# Patient Record
Sex: Female | Born: 1955 | Race: White | Hispanic: No | Marital: Married | Smoking: Never smoker
Health system: Southern US, Community
[De-identification: ages and names within clinical notes are randomized; demographics above are authoritative.]

## PROBLEM LIST (undated history)

## (undated) DIAGNOSIS — E785 Hyperlipidemia, unspecified: Secondary | ICD-10-CM

## (undated) DIAGNOSIS — R002 Palpitations: Secondary | ICD-10-CM

## (undated) DIAGNOSIS — E079 Disorder of thyroid, unspecified: Secondary | ICD-10-CM

## (undated) HISTORY — DX: Palpitations: R00.2

## (undated) HISTORY — DX: Hyperlipidemia, unspecified: E78.5

## (undated) HISTORY — DX: Disorder of thyroid, unspecified: E07.9

## (undated) HISTORY — PX: NASAL FRACTURE SURGERY: SHX718

---
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
## 2015-11-17 ENCOUNTER — Telehealth: Payer: Self-pay | Admitting: Cardiovascular Disease

## 2015-11-17 NOTE — Telephone Encounter (Signed)
Received records from [facility] for appointment on 12/07/15 with Dr Gwenlyn Found.  Records given to [facility] (medical records) for Dr Kennon Holter schedule on 12/07/15. lp

## 2015-11-21 ENCOUNTER — Telehealth: Payer: Self-pay | Admitting: Cardiovascular Disease

## 2015-11-21 NOTE — Telephone Encounter (Signed)
[id number] Received Referral packet from [facility] for upcoming appointment with Dr. Gwenlyn Found on 12/07/2015.  Records giving to [facility]. cbr

## 2015-12-01 ENCOUNTER — Encounter: Payer: Self-pay | Admitting: Cardiovascular Disease

## 2015-12-01 ENCOUNTER — Ambulatory Visit (INDEPENDENT_AMBULATORY_CARE_PROVIDER_SITE_OTHER): Payer: BC Managed Care – PPO | Admitting: Cardiovascular Disease

## 2015-12-01 VITALS — BP 104/68 | HR 69 | Ht 70.0 in | Wt 170.2 lb

## 2015-12-01 DIAGNOSIS — R002 Palpitations: Secondary | ICD-10-CM | POA: Diagnosis not present

## 2015-12-01 NOTE — Progress Notes (Signed)
12/01/2015 Michelle Cochran   [birth year]  [id number]  Primary Physician Manon Hilding, MD Primary Cardiologist: Lorretta Harp MD Renae Gloss   HPI:  Michelle Cochran is a delightful 60 year old mildly overweight married Caucasian female mother of 2 accompanied by her husband Michelle Cochran today. She is referred by Dr. Quintin Alto for cardiac evaluation because of palpitations. She has no cardiac risk factors She's never had a heart attack or stroke. She denies chest pain or shortness of breath. She is fairly active around the house. She had one episode of prolonged palpitations 2 years ago while she was at the [place] resort in the evening after having more than her usual amount of alcohol. The symptoms lasted for several hours and resolved spontaneously. She sets one subsequent episode was less intense.   Current Outpatient Prescriptions  Medication Sig Dispense Refill  . levothyroxine (SYNTHROID, LEVOTHROID) 50 MCG tablet Take 50 mcg by mouth daily.    . meloxicam (MOBIC) 15 MG tablet Take 15 mg by mouth as needed.  2  . sertraline (ZOLOFT) 100 MG tablet Take 100 mg by mouth daily.  6   No current facility-administered medications for this visit.    No Known Allergies  Social History   Social History  . Marital Status: Married    Spouse Name: N/A  . Number of Children: N/A  . Years of Education: N/A   Occupational History  . Not on file.   Social History Main Topics  . Smoking status: Never Smoker   . Smokeless tobacco: Not on file  . Alcohol Use: 0.6 oz/week    1 Standard drinks or equivalent per week     Comment: social   . Drug Use: No  . Sexual Activity: Not on file   Other Topics Concern  . Not on file   Social History Narrative  . No narrative on file     Review of Systems: General: negative for chills, fever, night sweats or weight changes.  Cardiovascular: negative for chest pain, dyspnea on exertion, edema, orthopnea, palpitations, paroxysmal nocturnal  dyspnea or shortness of breath Dermatological: negative for rash Respiratory: negative for cough or wheezing Urologic: negative for hematuria Abdominal: negative for nausea, vomiting, diarrhea, bright red blood per rectum, melena, or hematemesis Neurologic: negative for visual changes, syncope, or dizziness All other systems reviewed and are otherwise negative except as noted above.    Blood pressure 104/68, pulse 69, height 5\' 10"  (1.778 m), weight 170 lb 3.2 oz (77.202 kg).  General appearance: alert and no distress Neck: no adenopathy, no carotid bruit, no JVD, supple, symmetrical, trachea midline and thyroid not enlarged, symmetric, no tenderness/mass/nodules Lungs: clear to auscultation bilaterally Heart: regular rate and rhythm, S1, S2 normal, no murmur, click, rub or gallop Extremities: extremities normal, atraumatic, no cyanosis or edema  EKG normal sinus rhythm at 69 with an estimated T-wave changes. I personally reviewed this EKG  ASSESSMENT AND PLAN:   Palpitations Michelle Cochran is a 60 year old mildly overweight married Caucasian female mother of 2 children referred by her primary care physician for evaluation of palpitations. She has no chronic retractors. Her first episode of palpitations was approximately 2 years ago while she was at the [place] resort at night after having more than her usual amount of alcohol. The episode lasted 3 or 4 hours and then resolve spontaneously. She's had one less intense episode since that time but otherwise does not complain of palpitations. She is very active around the house. She denies chest  pain or shortness of breath.      Lorretta Harp MD FACP,FACC,FAHA, [facility] 12/01/2015 4:04 PM

## 2015-12-01 NOTE — Assessment & Plan Note (Signed)
Michelle Cochran is a 60 year old mildly overweight married Caucasian female mother of 2 children referred by her primary care physician for evaluation of palpitations. She has no chronic retractors. Her first episode of palpitations was approximately 2 years ago while she was at the break [place] resort at night after having more than her usual amount of alcohol. The episode lasted 3 or 4 hours and then resolve spontaneously. She's had one less intense episode since that time but otherwise does not complain of palpitations. She is very active around the house. She denies chest pain or shortness of breath.

## 2015-12-01 NOTE — Patient Instructions (Signed)
Medication Instructions:  Your physician recommends that you continue on your current medications as directed. Please refer to the Current Medication list given to you today.   Labwork: NONE  Testing/Procedures: NONE  Follow-Up: Follow up with Dr. Gwenlyn Found as needed.   Any Other Special Instructions Will Be Listed Below (If Applicable).     If you need a refill on your cardiac medications before your next appointment, please call your pharmacy.

## 2015-12-07 ENCOUNTER — Ambulatory Visit: Payer: BC Managed Care – PPO | Admitting: Cardiovascular Disease

## 2016-07-02 ENCOUNTER — Emergency Department (HOSPITAL_COMMUNITY)
Admission: EM | Admit: 2016-07-02 | Discharge: 2016-07-02 | Disposition: A | Discharge status: Home / Self Care | Payer: BC Managed Care – PPO | Attending: Emergency Medicine | Admitting: Emergency Medicine

## 2016-07-02 ENCOUNTER — Emergency Department (HOSPITAL_COMMUNITY): Payer: BC Managed Care – PPO

## 2016-07-02 ENCOUNTER — Encounter (HOSPITAL_COMMUNITY): Payer: Self-pay | Admitting: Emergency Medicine

## 2016-07-02 DIAGNOSIS — M546 Pain in thoracic spine: Secondary | ICD-10-CM | POA: Diagnosis present

## 2016-07-02 DIAGNOSIS — Y929 Unspecified place or not applicable: Secondary | ICD-10-CM | POA: Insufficient documentation

## 2016-07-02 DIAGNOSIS — Z79899 Other long term (current) drug therapy: Secondary | ICD-10-CM | POA: Diagnosis not present

## 2016-07-02 DIAGNOSIS — T148XXA Other injury of unspecified body region, initial encounter: Secondary | ICD-10-CM

## 2016-07-02 DIAGNOSIS — S39012A Strain of muscle, fascia and tendon of lower back, initial encounter: Secondary | ICD-10-CM | POA: Insufficient documentation

## 2016-07-02 DIAGNOSIS — X58XXXA Exposure to other specified factors, initial encounter: Secondary | ICD-10-CM | POA: Diagnosis not present

## 2016-07-02 DIAGNOSIS — Y939 Activity, unspecified: Secondary | ICD-10-CM | POA: Diagnosis not present

## 2016-07-02 DIAGNOSIS — Y999 Unspecified external cause status: Secondary | ICD-10-CM | POA: Diagnosis not present

## 2016-07-02 LAB — CBC WITH DIFFERENTIAL/PLATELET
Basophils Absolute: 0 10*3/uL (ref 0.0–0.1)
Basophils Relative: 1 %
Eosinophils Absolute: 0 10*3/uL (ref 0.0–0.7)
Eosinophils Relative: 0 %
HCT: 44.9 % (ref 36.0–46.0)
HEMOGLOBIN: 14.9 g/dL (ref 12.0–15.0)
LYMPHS ABS: 1.5 10*3/uL (ref 0.7–4.0)
LYMPHS PCT: 21 %
MCH: 30.5 pg (ref 26.0–34.0)
MCHC: 33.2 g/dL (ref 30.0–36.0)
MCV: 91.8 fL (ref 78.0–100.0)
Monocytes Absolute: 0.6 10*3/uL (ref 0.1–1.0)
Monocytes Relative: 8 %
NEUTROS PCT: 70 %
Neutro Abs: 5.3 10*3/uL (ref 1.7–7.7)
Platelets: 216 10*3/uL (ref 150–400)
RBC: 4.89 MIL/uL (ref 3.87–5.11)
RDW: 12.9 % (ref 11.5–15.5)
WBC: 7.5 10*3/uL (ref 4.0–10.5)

## 2016-07-02 LAB — COMPREHENSIVE METABOLIC PANEL
ALT: 14 U/L (ref 14–54)
AST: 19 U/L (ref 15–41)
Albumin: 4.7 g/dL (ref 3.5–5.0)
Alkaline Phosphatase: 95 U/L (ref 38–126)
Anion gap: 9 (ref 5–15)
BUN: 17 mg/dL (ref 6–20)
CHLORIDE: 103 mmol/L (ref 101–111)
CO2: 25 mmol/L (ref 22–32)
Calcium: 9.6 mg/dL (ref 8.9–10.3)
Creatinine, Ser: 0.72 mg/dL (ref 0.44–1.00)
Glucose, Bld: 110 mg/dL — ABNORMAL HIGH (ref 65–99)
POTASSIUM: 3.8 mmol/L (ref 3.5–5.1)
Sodium: 137 mmol/L (ref 135–145)
Total Bilirubin: 0.5 mg/dL (ref 0.3–1.2)
Total Protein: 8 g/dL (ref 6.5–8.1)

## 2016-07-02 LAB — LIPASE, BLOOD: LIPASE: 40 U/L (ref 11–51)

## 2016-07-02 MED ORDER — KETOROLAC TROMETHAMINE 10 MG PO TABS: 10.0000 mg | ORAL_TABLET | Freq: Four times a day (QID) | ORAL | 0 refills | Status: DC | PRN

## 2016-07-02 MED ORDER — ONDANSETRON 8 MG PO TBDP
8.0000 mg | ORAL_TABLET | Freq: Once | ORAL | Status: AC
  Administered 2016-07-02: 8 mg via ORAL
  Filled 2016-07-02: qty 1

## 2016-07-02 MED ORDER — DIAZEPAM 5 MG PO TABS
5.0000 mg | ORAL_TABLET | Freq: Once | ORAL | Status: AC
  Administered 2016-07-02: 5 mg via ORAL
  Filled 2016-07-02: qty 1

## 2016-07-02 MED ORDER — DIAZEPAM 5 MG PO TABS: 5.0000 mg | ORAL_TABLET | Freq: Three times a day (TID) | ORAL | 0 refills | Status: DC | PRN

## 2016-07-02 MED ORDER — KETOROLAC TROMETHAMINE 60 MG/2ML IM SOLN
60.0000 mg | Freq: Once | INTRAMUSCULAR | Status: AC
  Administered 2016-07-02: 60 mg via INTRAMUSCULAR
  Filled 2016-07-02: qty 2

## 2016-07-02 MED ORDER — OXYCODONE-ACETAMINOPHEN 5-325 MG PO TABS
1.0000 | ORAL_TABLET | Freq: Once | ORAL | Status: AC
  Administered 2016-07-02: 1 via ORAL
  Filled 2016-07-02: qty 1

## 2016-07-02 NOTE — ED Notes (Signed)
Spouse came to nursing station requesting "something else for her pain, like a shot or something." Advised Tammy Tripplett PA.

## 2016-07-02 NOTE — Discharge Instructions (Signed)
Apply ice packs on/off.  Follow-up with your doctor for recheck this week if not improving.

## 2016-07-02 NOTE — ED Notes (Signed)
Pt states her pain "feels like a pinched nerve."

## 2016-07-02 NOTE — ED Provider Notes (Signed)
[facility] DEPT Provider Note   CSN: [id number] Arrival date & time: 07/02/16  1100  By signing my name below, I, Michelle Cochran, attest that this documentation has been prepared under the direction and in the presence of Elin Seats, PA-C. Electronically Signed: Judithann Sauger, ED Scribe. 07/02/16. 12:44 PM.   History   Chief Complaint Chief Complaint  Patient presents with  . Back Pain    HPI Comments: Michelle Cochran is a 60 y.o. female with a hx of Thyroid disease who presents to the Emergency Department complaining of sudden onset, constant 8/10 right upper back pain that intermittently radiates up her right posterior shoulder onset 8 am this morning. She notes that standing up and raising her right arm makes the pain worse and applying pressure to the area does not affect it.  She denies any recent falls, injuries, or known trauma. No alleviating factors noted. Pt has not tried any medications PTA.  She denies noticing episodes of nausea after eating, or a hx of abdominal surgeries or pneumonia. She also denies any fever, chills, cough, chest pain, shortness of breath, neck pain, abdominal pain, vomiting, weakness, numbness/tingling, or any other symptoms.    The history is provided by the patient. No language interpreter was used.  Back Pain   This is a new problem. The current episode started 3 to 5 hours ago. The problem has been gradually worsening. The pain is associated with no known injury. The pain is present in the thoracic spine. The pain is moderate. The symptoms are aggravated by certain positions. The pain is the same all the time. Pertinent negatives include no chest pain, no fever, no numbness, no headaches, no abdominal pain, no dysuria and no weakness. She has tried nothing for the symptoms. The treatment provided no relief.    Past Medical History:  Diagnosis Date  . Palpitations   . Thyroid disease     Patient Active Problem List   Diagnosis Date  Noted  . Palpitations 12/01/2015    History reviewed. No pertinent surgical history.  OB History    Gravida Para Term Preterm AB Living   2 2 2          SAB TAB Ectopic Multiple Live Births                   Home Medications    Prior to Admission medications   Medication Sig Start Date End Date Taking? Authorizing Provider  levothyroxine (SYNTHROID, LEVOTHROID) 50 MCG tablet Take 50 mcg by mouth daily. 10/11/15  Yes Historical Provider, MD    Family History Family History  Problem Relation Age of Onset  . Arrhythmia Father     afib  . Heart attack Maternal Grandfather 40  . Heart attack Paternal Grandmother 58  . Cancer Paternal Grandfather     Social History Social History  Substance Use Topics  . Smoking status: Never Smoker  . Smokeless tobacco: Never Used  . Alcohol use 0.6 oz/week    1 Standard drinks or equivalent per week     Comment: social      Allergies   Patient has no known allergies.   Review of Systems Review of Systems  Constitutional: Negative for chills and fever.  Eyes: Negative for visual disturbance.  Respiratory: Negative for cough and shortness of breath.   Cardiovascular: Negative for chest pain.  Gastrointestinal: Negative for abdominal distention, abdominal pain and vomiting.  Genitourinary: Negative for dysuria and flank pain.  Musculoskeletal: Positive for back pain.  Neurological: Negative for weakness, numbness and headaches.     Physical Exam Updated Vital Signs BP 130/69 (BP Location: Left Arm)   Pulse 95   Temp 97.5 F (36.4 C) (Oral)   Resp 22   Ht 5\' 9"  (1.753 m)   Wt 165 lb (74.8 kg)   SpO2 100%   BMI 24.37 kg/m   Physical Exam  Constitutional: She is oriented to person, place, and time. She appears well-developed and well-nourished.  HENT:  Head: Normocephalic and atraumatic.  Cardiovascular: Normal rate.   Pulmonary/Chest: Effort normal.  Musculoskeletal: She exhibits tenderness.       Right shoulder:  She exhibits tenderness and spasm. She exhibits normal range of motion, no bony tenderness, no swelling and normal strength.       Arms: Focal tenderness around the right subscapular region; no edema, rash, or erythema; no spinal tenderness. No motor weakness on exam, distal sensation intact.   Neurological: She is alert and oriented to person, place, and time.  Skin: Skin is warm and dry.  Psychiatric: She has a normal mood and affect.  Nursing note and vitals reviewed.    ED Treatments / Results  DIAGNOSTIC STUDIES: Oxygen Saturation is 100% on RA, normal by my interpretation.    COORDINATION OF CARE: 12:39 PM- Pt advised of plan for treatment and pt agrees. Pt will receive lab work and right rib x-ray for further evaluation.    Labs (all labs ordered are listed, but only abnormal results are displayed) Labs Reviewed  COMPREHENSIVE METABOLIC PANEL - Abnormal; Notable for the following:       Result Value   Glucose, Bld 110 (*)    All other components within normal limits  CBC WITH DIFFERENTIAL/PLATELET  LIPASE, BLOOD    EKG  EKG Interpretation None       Radiology Dg Ribs Unilateral W/chest Right  Result Date: 07/02/2016 CLINICAL DATA:  RIGHT upper back pain EXAM: RIGHT RIBS AND CHEST - 3+ VIEW COMPARISON:  None. FINDINGS: Normal mediastinum and cardiac silhouette. Normal pulmonary vasculature. No evidence of effusion, infiltrate, or pneumothorax. No acute bony abnormality. Dedicated views of the RIGHT ribs demonstrate no displaced fracture. IMPRESSION: 1. No acute cardiopulmonary findings. 2. No RIGHT rib abnormality. Electronically Signed   By: Suzy Bouchard M.D.   On: 07/02/2016 13:55    Procedures Procedures (including critical care time)  Medications Ordered in ED Medications  oxyCODONE-acetaminophen (PERCOCET/ROXICET) 5-325 MG per tablet 1 tablet (1 tablet Oral Given 07/02/16 1252)  ondansetron (ZOFRAN-ODT) disintegrating tablet 8 mg (8 mg Oral Given  07/02/16 1252)  ketorolac (TORADOL) injection 60 mg (60 mg Intramuscular Given 07/02/16 1431)  diazepam (VALIUM) tablet 5 mg (5 mg Oral Given 07/02/16 1431)     Initial Impression / Assessment and Plan / ED Course  Yohann Curl, PA-C has reviewed the triage vital signs and the nursing notes.  Pertinent labs & imaging results that were available during my care of the patient were reviewed by me and considered in my medical decision making (see chart for details).  Clinical Course     Pt with reproducible tenderness of the right scapular region.  Pt is comfortable appearing.  Vitals stable.  Sx's are likely musculoskeletal.  No abd pain, vomiting to suggest cholecystitis, doubt dissection.  Pt is feeling better, has ambulated to the desk and requested fluids.  Agrees to symptomatic tx and close PMD f/u.  Return precautions given.  The patient appears reasonably screened and/or stabilized for discharge and I doubt  any other medical condition or other [facility] requiring further screening, evaluation, or treatment in the ED at this time prior to discharge.   Final Clinical Impressions(s) / ED Diagnoses   Final diagnoses:  Muscle strain    New Prescriptions New Prescriptions   No medications on file   I personally performed the services described in this documentation, which was scribed in my presence. The recorded information has been reviewed and is accurate.    Kem Parkinson, PA-C 07/05/16 1514    Elnora Morrison, MD 07/07/16 [id number]

## 2016-07-02 NOTE — ED Triage Notes (Signed)
Pt reports R sided thoracic pain that started at approx 0800 today. No known injury. Pt states standing makes the pain worse. Pt denies tenderness to touch. Pt with increased pain with raising her R arm.

## 2017-02-21 ENCOUNTER — Other Ambulatory Visit (HOSPITAL_COMMUNITY): Payer: Self-pay | Admitting: Family Medicine

## 2017-02-21 ENCOUNTER — Other Ambulatory Visit: Payer: Self-pay | Admitting: Family Medicine

## 2017-02-21 DIAGNOSIS — R221 Localized swelling, mass and lump, neck: Secondary | ICD-10-CM

## 2017-02-21 DIAGNOSIS — R591 Generalized enlarged lymph nodes: Secondary | ICD-10-CM

## 2017-02-22 ENCOUNTER — Ambulatory Visit
Admission: RE | Admit: 2017-02-22 | Discharge: 2017-02-22 | Disposition: A | Discharge status: Home / Self Care | Payer: BC Managed Care – PPO | Source: Ambulatory Visit | Attending: Family Medicine | Admitting: Family Medicine

## 2017-02-22 DIAGNOSIS — R221 Localized swelling, mass and lump, neck: Secondary | ICD-10-CM

## 2017-02-22 DIAGNOSIS — R591 Generalized enlarged lymph nodes: Secondary | ICD-10-CM

## 2017-11-26 IMAGING — DX DG RIBS W/ CHEST 3+V*R*
3 series · 3 of 3 positions shown · non-contrast
Comparison: None.

CLINICAL DATA: RIGHT upper back pain

EXAM:
RIGHT RIBS AND CHEST - 3+ VIEW

[chest pa]
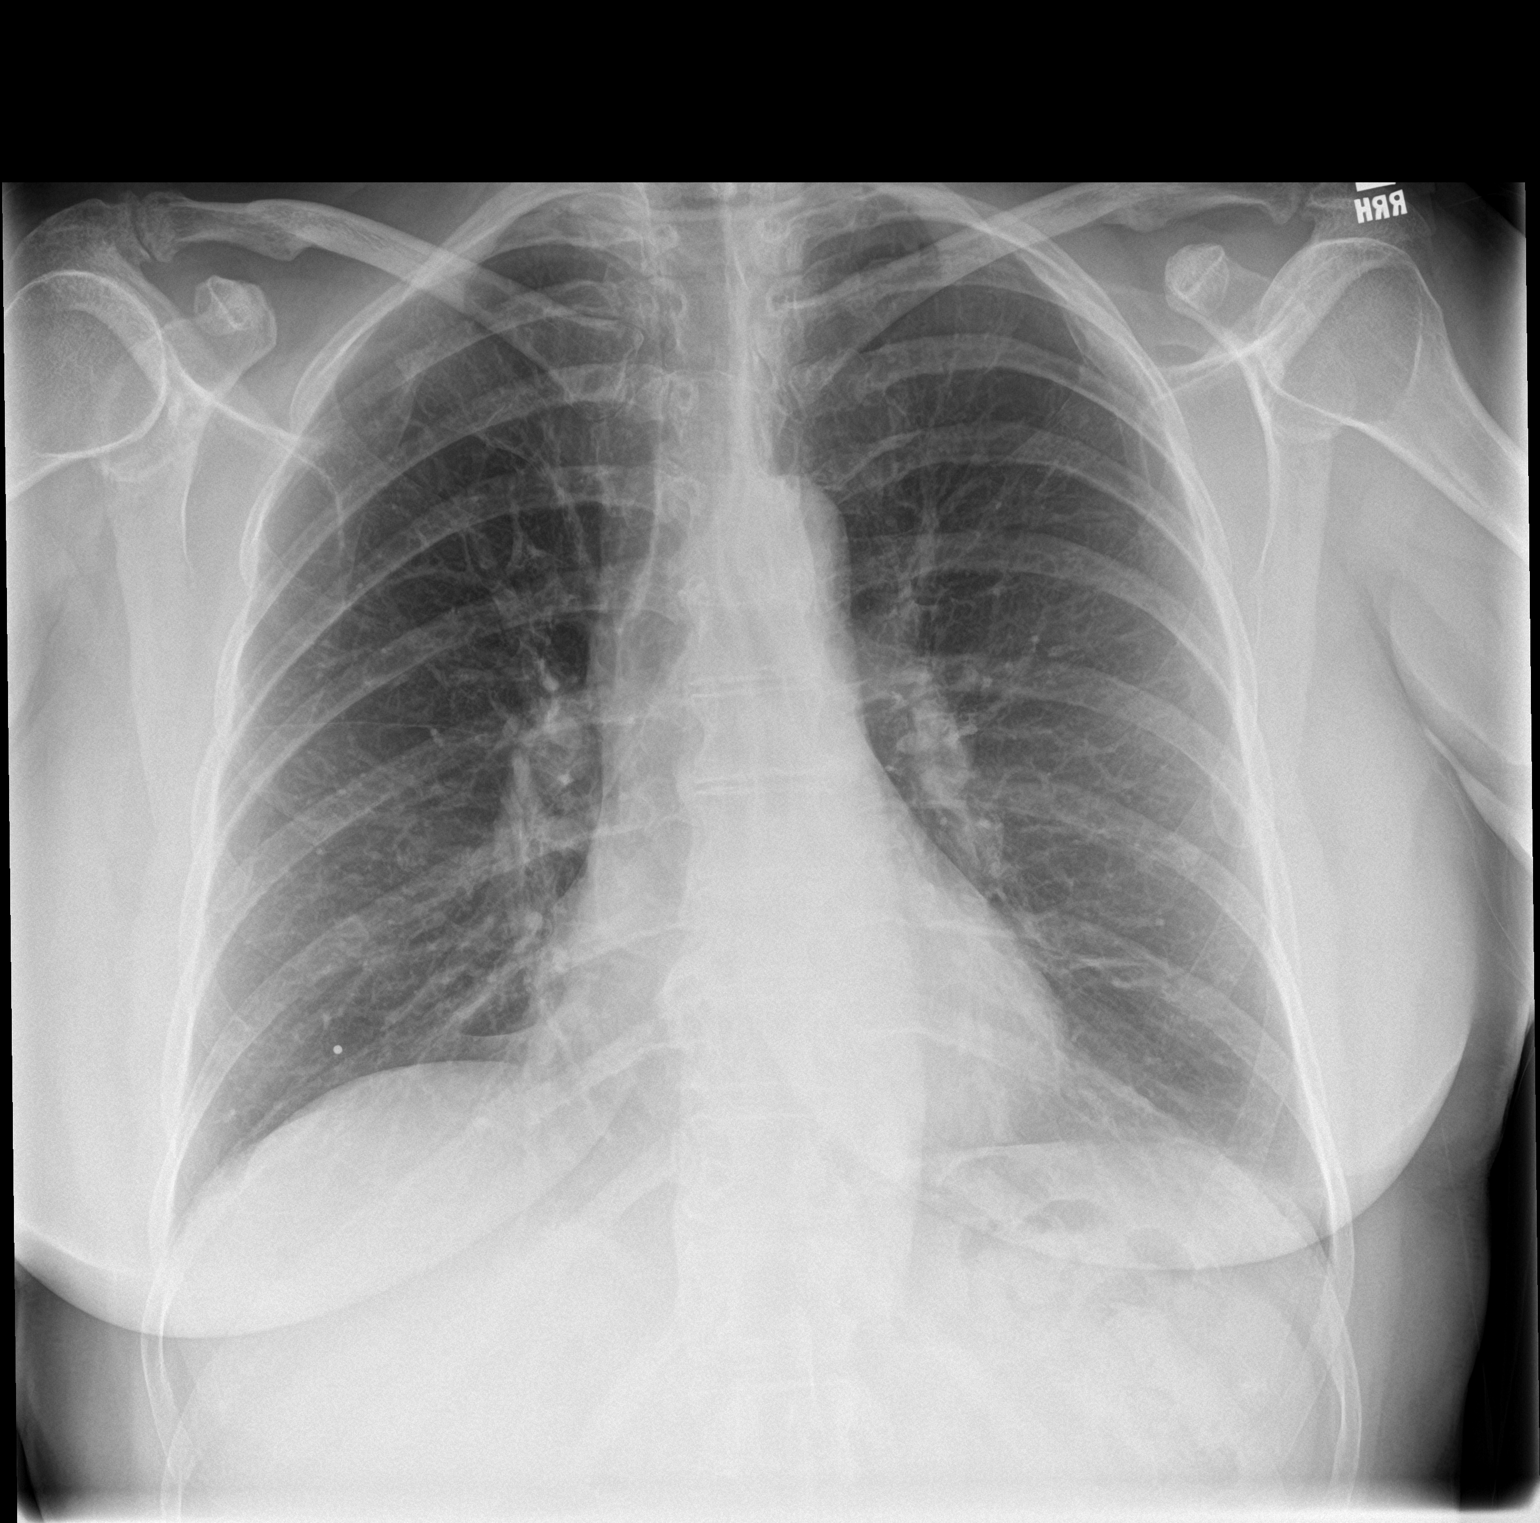

[rib pa]
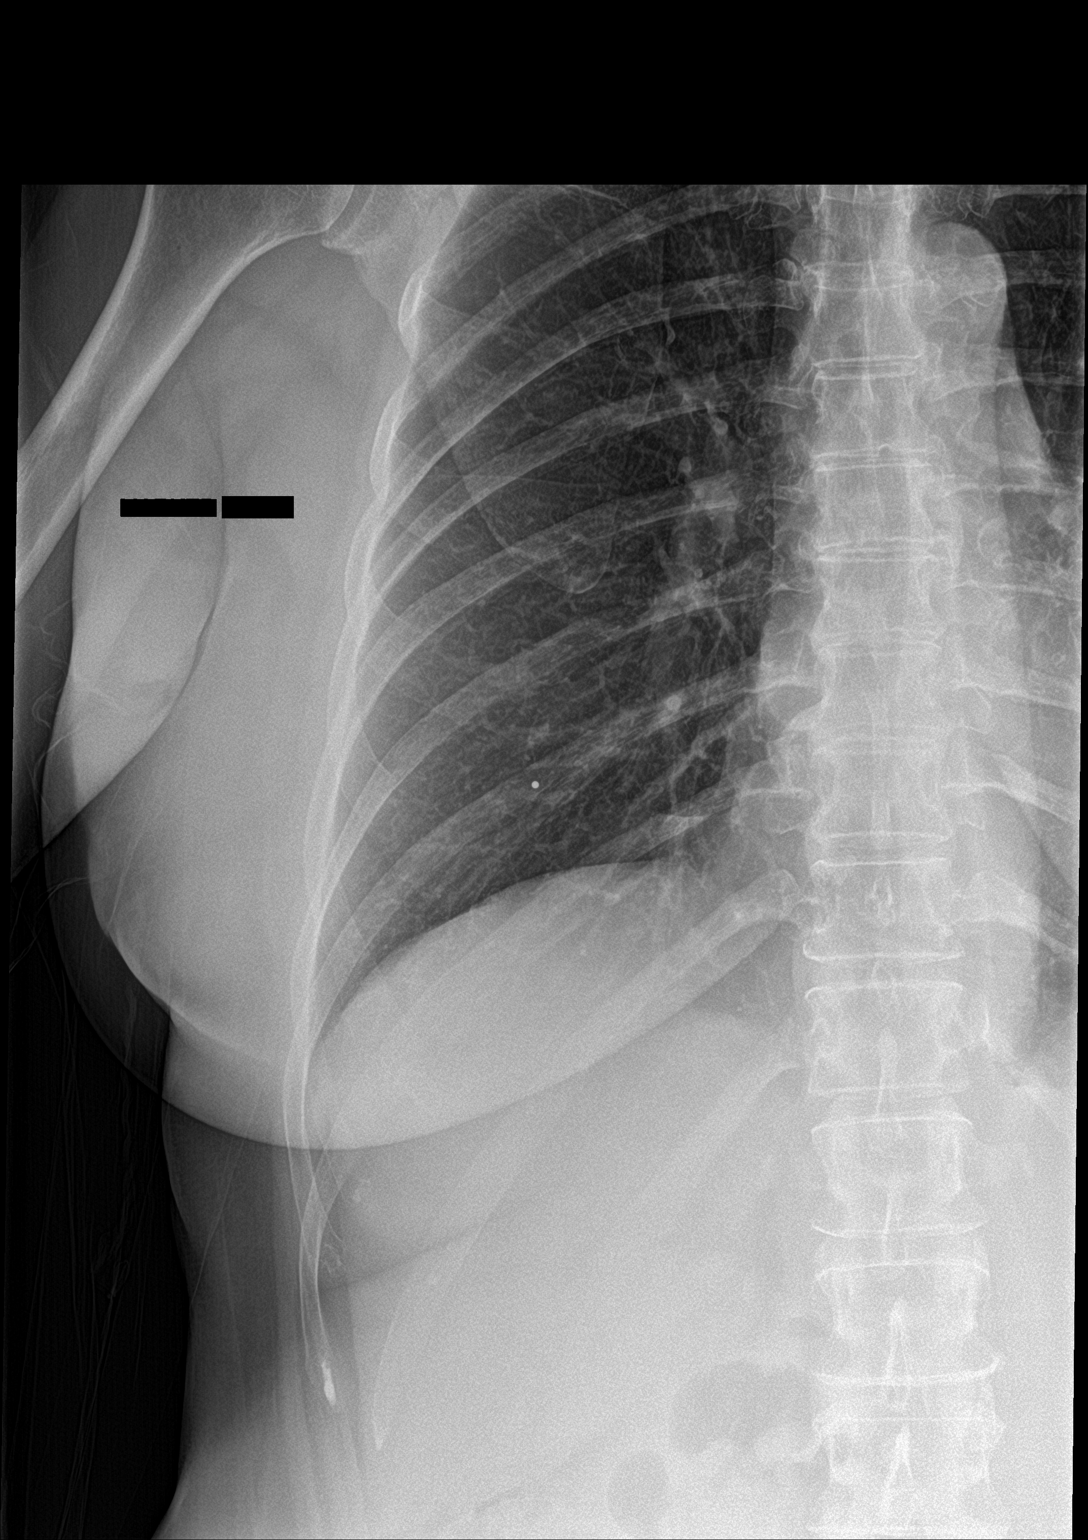

[rib pa obl]
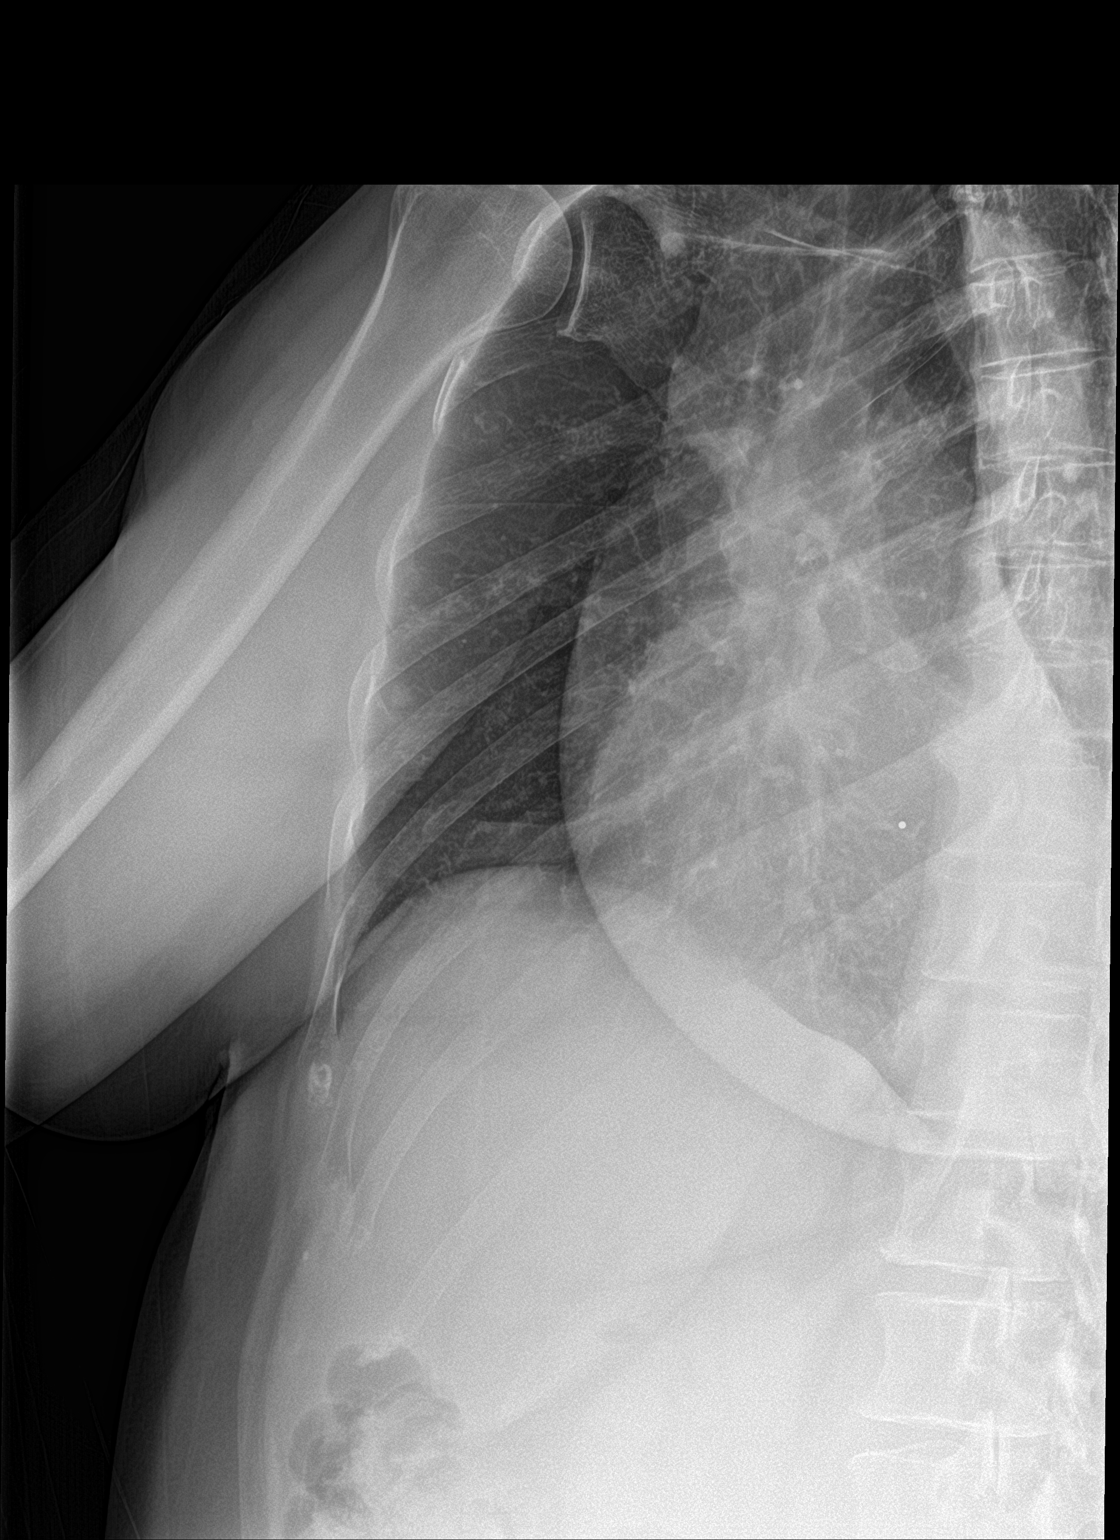

[3 of 3 positions shown; findings below may reference images not displayed]

FINDINGS: Normal mediastinum and cardiac silhouette. Normal pulmonary
vasculature. No evidence of effusion, infiltrate, or pneumothorax.
No acute bony abnormality.

Dedicated views of the RIGHT ribs demonstrate no displaced fracture.
IMPRESSION: 1. No acute cardiopulmonary findings.
2. No RIGHT rib abnormality.

## 2018-05-29 ENCOUNTER — Encounter (INDEPENDENT_AMBULATORY_CARE_PROVIDER_SITE_OTHER): Payer: BC Managed Care – PPO | Admitting: Ophthalmology

## 2018-05-29 DIAGNOSIS — H43813 Vitreous degeneration, bilateral: Secondary | ICD-10-CM | POA: Diagnosis not present

## 2018-07-10 ENCOUNTER — Encounter (INDEPENDENT_AMBULATORY_CARE_PROVIDER_SITE_OTHER): Payer: BC Managed Care – PPO | Admitting: Ophthalmology

## 2020-12-13 ENCOUNTER — Ambulatory Visit (INDEPENDENT_AMBULATORY_CARE_PROVIDER_SITE_OTHER): Payer: Medicare Other | Admitting: Otolaryngology

## 2020-12-13 ENCOUNTER — Encounter (INDEPENDENT_AMBULATORY_CARE_PROVIDER_SITE_OTHER): Payer: Self-pay | Admitting: Otolaryngology

## 2020-12-13 ENCOUNTER — Other Ambulatory Visit: Payer: Self-pay

## 2020-12-13 VITALS — Temp 98.1°F

## 2020-12-13 DIAGNOSIS — H938X1 Other specified disorders of right ear: Secondary | ICD-10-CM

## 2020-12-13 NOTE — Progress Notes (Signed)
HPI: Michelle Cochran is a 65 y.o. female who returns today for evaluation of right ear.  She feels like the right ear is been stopped up ever since she had a cold about 6 weeks ago.  She has previously been seen here in the past for wax buildup in her ears.  Her last visit was over 6 years ago..  Past Medical History:  Diagnosis Date  . Palpitations   . Thyroid disease    No past surgical history on file. Social History   Socioeconomic History  . Marital status: Married    Spouse name: Not on file  . Number of children: Not on file  . Years of education: Not on file  . Highest education level: Not on file  Occupational History  . Not on file  Tobacco Use  . Smoking status: Never Smoker  . Smokeless tobacco: Never Used  Substance and Sexual Activity  . Alcohol use: Yes    Alcohol/week: 1.0 standard drink    Types: 1 Standard drinks or equivalent per week    Comment: social   . Drug use: No  . Sexual activity: Not on file  Other Topics Concern  . Not on file  Social History Narrative  . Not on file   Social Determinants of Health   Financial Resource Strain: Not on file  Food Insecurity: Not on file  Transportation Needs: Not on file  Physical Activity: Not on file  Stress: Not on file  Social Connections: Not on file   Family History  Problem Relation Age of Onset  . Arrhythmia Father        afib  . Heart attack Maternal Grandfather 40  . Heart attack Paternal Grandmother 9  . Cancer Paternal Grandfather    No Known Allergies Prior to Admission medications   Medication Sig Start Date End Date Taking? Authorizing Provider  diazepam (VALIUM) 5 MG tablet Take 1 tablet (5 mg total) by mouth every 8 (eight) hours as needed for muscle spasms. 07/02/16   Triplett, Tammy, PA-C  ketorolac (TORADOL) 10 MG tablet Take 1 tablet (10 mg total) by mouth every 6 (six) hours as needed. 07/02/16   Triplett, Tammy, PA-C  levothyroxine (SYNTHROID, LEVOTHROID) 50 MCG tablet Take 50  mcg by mouth daily. 10/11/15   [provider]     Positive ROS: Otherwise negative  All other systems have been reviewed and were otherwise negative with the exception of those mentioned in the HPI and as above.  Physical Exam: Constitutional: Alert, well-appearing, no acute distress Ears: External ears without lesions or tenderness.  She has small ear canals bilaterally.  Minimal wax buildup that is nonobstructing.  TMs were clear bilaterally with no middle ear effusion noted.  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  AC was greater than BC bilaterally. Nasal: External nose without lesions. Septum with mild deformity and mild rhinitis.  No signs of infection. Clear nasal passages otherwise. Oral: Lips and gums without lesions. Tongue and palate mucosa without lesions. Posterior oropharynx clear. Neck: No palpable adenopathy or masses Respiratory: Breathing comfortably  Skin: No facial/neck lesions or rash noted.  Procedures  Assessment: Right ear congestion.  No significant wax buildup or middle ear effusion noted.  Plan: She will follow-up as needed.   Radene Journey, MD

## 2021-04-18 ENCOUNTER — Encounter: Payer: Self-pay | Admitting: Internal Medicine

## 2021-05-19 ENCOUNTER — Other Ambulatory Visit: Payer: Self-pay

## 2021-05-19 ENCOUNTER — Ambulatory Visit (AMBULATORY_SURGERY_CENTER): Payer: Medicare Other | Admitting: *Deleted

## 2021-05-19 VITALS — Ht 69.5 in | Wt 165.0 lb

## 2021-05-19 DIAGNOSIS — R195 Other fecal abnormalities: Secondary | ICD-10-CM

## 2021-05-19 MED ORDER — SUTAB 1479-225-188 MG PO TABS: 1.0000 | ORAL_TABLET | Freq: Once | ORAL | 0 refills | Status: AC

## 2021-05-19 NOTE — Progress Notes (Signed)
Virtual pre visit completed over telephone.  Patient requests pills over liquid. Instructions forwarded through MyChart and sent to e-mail [email or web address] Mailed Sutab coupon to patient.    No egg or soy allergy known to patient  No issues known to pt with past sedation with any surgeries or procedures Patient denies ever being told they had issues or difficulty with intubation  No FH of Malignant Hyperthermia Pt is not on diet pills Pt is not on  home 02  Pt is not on blood thinners  Pt denies issues with constipation  No A fib or A flutter  Pt is fully vaccinated  for Covid     Due to the COVID-19 pandemic we are asking patients to follow certain guidelines in [place] and the [place]   Pt aware of COVID protocols and [facility] guidelines

## 2021-06-02 ENCOUNTER — Other Ambulatory Visit: Payer: Self-pay

## 2021-06-02 ENCOUNTER — Encounter: Payer: Self-pay | Admitting: Internal Medicine

## 2021-06-02 ENCOUNTER — Ambulatory Visit (AMBULATORY_SURGERY_CENTER): Payer: Medicare Other | Admitting: Internal Medicine

## 2021-06-02 VITALS — BP 146/81 | HR 67 | Temp 97.8°F | Resp 13 | Ht 69.5 in | Wt 168.0 lb

## 2021-06-02 DIAGNOSIS — D122 Benign neoplasm of ascending colon: Secondary | ICD-10-CM | POA: Diagnosis not present

## 2021-06-02 DIAGNOSIS — R195 Other fecal abnormalities: Secondary | ICD-10-CM

## 2021-06-02 DIAGNOSIS — K5730 Diverticulosis of large intestine without perforation or abscess without bleeding: Secondary | ICD-10-CM | POA: Diagnosis not present

## 2021-06-02 HISTORY — PX: COLONOSCOPY: SHX174

## 2021-06-02 MED ORDER — SODIUM CHLORIDE 0.9 % IV SOLN
500.0000 mL | Freq: Once | INTRAVENOUS | Status: DC
Start: 2021-06-02 — End: 2021-06-02

## 2021-06-02 NOTE — Progress Notes (Signed)
Called to room to assist during endoscopic procedure.  Patient ID and intended procedure confirmed with present staff. Received instructions for my participation in the procedure from the performing physician.  

## 2021-06-02 NOTE — Op Note (Signed)
[facility] Patient Name: Michelle Cochran Procedure Date: 06/02/2021 8:07 AM MRN: [id number] Endoscopist: Docia Chuck. Henrene Pastor , MD Age: 65 Referring MD:  Date of Birth: May 21, 1956 Gender: Female Account #: [id number] Procedure:                Colonoscopy with EMR polypectomy; snare polypectomy                            x3; submucosal tattoo Indications:              Positive Cologuard test (March 30, 2021) Medicines:                Monitored Anesthesia Care Procedure:                Pre-Anesthesia Assessment:                           - Prior to the procedure, a History and Physical                            was performed, and patient medications and                            allergies were reviewed. The patient's tolerance of                            previous anesthesia was also reviewed. The risks                            and benefits of the procedure and the sedation                            options and risks were discussed with the patient.                            All questions were answered, and informed consent                            was obtained. Prior Anticoagulants: The patient has                            taken no previous anticoagulant or antiplatelet                            agents. ASA Grade Assessment: II - A patient with                            mild systemic disease. After reviewing the risks                            and benefits, the patient was deemed in                            satisfactory condition to undergo the procedure.  After obtaining informed consent, the colonoscope                            was passed under direct vision. Throughout the                            procedure, the patient's blood pressure, pulse, and                            oxygen saturations were monitored continuously. The                            CF [id number] #[id number] was introduced through the anus                            and  advanced to the the cecum, identified by                            appendiceal orifice and ileocecal valve. The                            ileocecal valve, appendiceal orifice, and rectum                            were photographed. The quality of the bowel                            preparation was excellent. The colonoscopy was                            performed without difficulty. The patient tolerated                            the procedure well. The bowel preparation used was                            SUPREP via split dose instruction. Scope In: 8:24:17 AM Scope Out: 8:54:33 AM Scope Withdrawal Time: 0 hours 27 minutes 34 seconds  Total Procedure Duration: 0 hours 30 minutes 16 seconds  Findings:                 A 15 mm polyp was found in the ascending colon. The                            polyp was semi-pedunculated. The polyp was removed                            with EMR technique (submucosal saline lift followed                            by hot snare polypectomy). Resection and retrieval                            were complete. See images.  A submucosal injection                            tattoo with carbon black was placed at the                            polypectomy site (slightly downstream). See images                           Three smaller polyps were found in the ascending                            colon. The polyps were 2 to 3 mm in size. These                            polyps were removed with a cold snare. Resection                            and retrieval were complete.                           Multiple diverticula were found in the sigmoid                            colon.                           The exam was otherwise without abnormality on                            direct and retroflexion views. Complications:            No immediate complications. Estimated blood loss:                            None. Estimated Blood Loss:     Estimated blood loss:  none. Impression:               - One 15 mm polyp in the ascending colon, removed                            with EMR technique. Resected and retrieved.                           - Three 2 to 3 mm polyps in the ascending colon,                            removed with a cold snare. Resected and retrieved.                           - Diverticulosis in the sigmoid colon.                           - The examination was otherwise normal on direct  and retroflexion views. Recommendation:           - Repeat colonoscopy in 3 years for surveillance,                            if polyps all benign.                           - Patient has a contact number available for                            emergencies. The signs and symptoms of potential                            delayed complications were discussed with the                            patient. Return to normal activities tomorrow.                            Written discharge instructions were provided to the                            patient.                           - Resume previous diet.                           - Continue present medications.                           - Await pathology results. Docia Chuck. Henrene Pastor, MD 06/02/2021 9:11:20 AM This report has been signed electronically.

## 2021-06-02 NOTE — Progress Notes (Signed)
Pt Drowsy. VSS. To PACU, report to RN. No anesthetic complications noted. Bovie site ok

## 2021-06-02 NOTE — Patient Instructions (Signed)
Please read handouts provided. Continue present medications. Await pathology results.   YOU HAD AN ENDOSCOPIC PROCEDURE TODAY AT [facility]:   Refer to the procedure report that was given to you for any specific questions about what was found during the examination.  If the procedure report does not answer your questions, please call your gastroenterologist to clarify.  If you requested that your care partner not be given the details of your procedure findings, then the procedure report has been included in a sealed envelope for you to review at your convenience later.  YOU SHOULD EXPECT: Some feelings of bloating in the abdomen. Passage of more gas than usual.  Walking can help get rid of the air that was put into your GI tract during the procedure and reduce the bloating. If you had a lower endoscopy (such as a colonoscopy or flexible sigmoidoscopy) you may notice spotting of blood in your stool or on the toilet paper. If you underwent a bowel prep for your procedure, you may not have a normal bowel movement for a few days.  Please Note:  You might notice some irritation and congestion in your nose or some drainage.  This is from the oxygen used during your procedure.  There is no need for concern and it should clear up in a day or so.  SYMPTOMS TO REPORT IMMEDIATELY:  Following lower endoscopy (colonoscopy or flexible sigmoidoscopy):  Excessive amounts of blood in the stool  Significant tenderness or worsening of abdominal pains  Swelling of the abdomen that is new, acute   For urgent or emergent issues, a gastroenterologist can be reached at any hour by calling [phone]. Do not use MyChart messaging for urgent concerns.    DIET:  We do recommend a small meal at first, but then you may proceed to your regular diet.  Drink plenty of fluids but you should avoid alcoholic beverages for 24 hours.  ACTIVITY:  You should plan to take it easy for the rest of today and you  should NOT DRIVE or use heavy machinery until tomorrow (because of the sedation medicines used during the test).    FOLLOW UP: Our staff will call the number listed on your records 48-72 hours following your procedure to check on you and address any questions or concerns that you may have regarding the information given to you following your procedure. If we do not reach you, we will leave a message.  We will attempt to reach you two times.  During this call, we will ask if you have developed any symptoms of COVID 19. If you develop any symptoms (ie: fever, flu-like symptoms, shortness of breath, cough etc.) before then, please call [phone].  If you test positive for Covid 19 in the 2 weeks post procedure, please call and report this information to [place].    If any biopsies were taken you will be contacted by phone or by letter within the next 1-3 weeks.  Please call us at [phone] if you have not heard about the biopsies in 3 weeks.    SIGNATURES/CONFIDENTIALITY: You and/or your care partner have signed paperwork which will be entered into your electronic medical record.  These signatures attest to the fact that that the information above on your After Visit Summary has been reviewed and is understood.  Full responsibility of the confidentiality of this discharge information lies with you and/or your care-partner.

## 2021-06-02 NOTE — Progress Notes (Signed)
HISTORY OF PRESENT ILLNESS:  Michelle Cochran is a 65 y.o. female who is sent today for colonoscopy to evaluate positive Cologuard testing collected March 30, 2021.  Reviewed.  No family history of colon cancer.  No active GI complaints  REVIEW OF SYSTEMS:  All non-GI ROS negative  Past Medical History:  Diagnosis Date   Hyperlipidemia    Palpitations    Thyroid disease     Past Surgical History:  Procedure Laterality Date   NASAL FRACTURE SURGERY      Social History Michelle Cochran  reports that she has never smoked. She has never used smokeless tobacco. She reports current alcohol use of about 1.0 standard drink per week. She reports that she does not use drugs.  family history includes Arrhythmia in her father; Cancer in her paternal grandfather; Heart attack (age of onset: 51) in her maternal grandfather; Heart attack (age of onset: 63) in her paternal grandmother.  No Known Allergies     PHYSICAL EXAMINATION:  Vital signs: BP (!) 144/82   Pulse 69   Temp 97.8 F (36.6 C) (Skin)   Resp 15   Ht 5' 9.5" (1.765 m)   Wt 168 lb (76.2 kg)   SpO2 100%   BMI 24.45 kg/m  General: Well-developed, well-nourished, no acute distress HEENT: Sclerae are anicteric, conjunctiva pink. Oral mucosa intact Lungs: Clear Heart: Regular Abdomen: soft, nontender, nondistended, no obvious ascites, no peritoneal signs, normal bowel sounds. No organomegaly. Extremities: No edema Psychiatric: alert and oriented x3. Cooperative     ASSESSMENT:  1.  Positive Cologuard   PLAN:   1.  Colonoscopy

## 2021-06-02 NOTE — Progress Notes (Signed)
Pt's states no medical or surgical changes since previsit or office visit. VS assessed by D.T 

## 2021-06-06 ENCOUNTER — Telehealth: Payer: Self-pay | Admitting: *Deleted

## 2021-06-06 NOTE — Telephone Encounter (Signed)
  Follow up Call-  Call back number 06/02/2021  Post procedure Call Back phone  # [phone]  Permission to leave phone message Yes  Some recent data might be hidden     Patient questions:  Do you have a fever, pain , or abdominal swelling? No. Pain Score  0 *  Have you tolerated food without any problems? Yes.    Have you been able to return to your normal activities? Yes.    Do you have any questions about your discharge instructions: Diet   No. Medications  No. Follow up visit  No.  Do you have questions or concerns about your Care? No.  Actions: * If pain score is 4 or above: No action needed, pain <4. Have you developed a fever since your procedure? no  2.   Have you had an respiratory symptoms (SOB or cough) since your procedure? no  3.   Have you tested positive for COVID 19 since your procedure no  4.   Have you had any family members/close contacts diagnosed with the COVID 19 since your procedure?  no   If yes to any of these questions please route to Joylene John, RN and Joella Prince, RN

## 2021-06-07 ENCOUNTER — Encounter: Payer: Self-pay | Admitting: Internal Medicine

## 2021-08-08 DIAGNOSIS — E7849 Other hyperlipidemia: Secondary | ICD-10-CM | POA: Diagnosis not present

## 2021-08-08 DIAGNOSIS — E782 Mixed hyperlipidemia: Secondary | ICD-10-CM | POA: Diagnosis not present

## 2021-08-08 DIAGNOSIS — E039 Hypothyroidism, unspecified: Secondary | ICD-10-CM | POA: Diagnosis not present

## 2021-09-28 DIAGNOSIS — H10013 Acute follicular conjunctivitis, bilateral: Secondary | ICD-10-CM | POA: Diagnosis not present

## 2021-11-22 DIAGNOSIS — R350 Frequency of micturition: Secondary | ICD-10-CM | POA: Diagnosis not present

## 2021-11-22 DIAGNOSIS — M545 Low back pain, unspecified: Secondary | ICD-10-CM | POA: Diagnosis not present

## 2021-12-13 DIAGNOSIS — L570 Actinic keratosis: Secondary | ICD-10-CM | POA: Diagnosis not present

## 2021-12-13 DIAGNOSIS — D492 Neoplasm of unspecified behavior of bone, soft tissue, and skin: Secondary | ICD-10-CM | POA: Diagnosis not present

## 2021-12-13 DIAGNOSIS — L814 Other melanin hyperpigmentation: Secondary | ICD-10-CM | POA: Diagnosis not present

## 2021-12-13 DIAGNOSIS — C44722 Squamous cell carcinoma of skin of right lower limb, including hip: Secondary | ICD-10-CM | POA: Diagnosis not present

## 2021-12-13 DIAGNOSIS — D225 Melanocytic nevi of trunk: Secondary | ICD-10-CM | POA: Diagnosis not present

## 2021-12-13 DIAGNOSIS — L820 Inflamed seborrheic keratosis: Secondary | ICD-10-CM | POA: Diagnosis not present

## 2021-12-13 DIAGNOSIS — L821 Other seborrheic keratosis: Secondary | ICD-10-CM | POA: Diagnosis not present

## 2022-01-10 DIAGNOSIS — C44722 Squamous cell carcinoma of skin of right lower limb, including hip: Secondary | ICD-10-CM | POA: Diagnosis not present

## 2022-01-10 DIAGNOSIS — C44729 Squamous cell carcinoma of skin of left lower limb, including hip: Secondary | ICD-10-CM | POA: Diagnosis not present

## 2022-01-10 DIAGNOSIS — D485 Neoplasm of uncertain behavior of skin: Secondary | ICD-10-CM | POA: Diagnosis not present

## 2022-01-24 DIAGNOSIS — C44722 Squamous cell carcinoma of skin of right lower limb, including hip: Secondary | ICD-10-CM | POA: Diagnosis not present

## 2022-01-24 DIAGNOSIS — L814 Other melanin hyperpigmentation: Secondary | ICD-10-CM | POA: Diagnosis not present

## 2022-01-24 DIAGNOSIS — D2372 Other benign neoplasm of skin of left lower limb, including hip: Secondary | ICD-10-CM | POA: Diagnosis not present

## 2022-01-31 DIAGNOSIS — M1712 Unilateral primary osteoarthritis, left knee: Secondary | ICD-10-CM | POA: Diagnosis not present

## 2022-01-31 DIAGNOSIS — Z6822 Body mass index (BMI) 22.0-22.9, adult: Secondary | ICD-10-CM | POA: Diagnosis not present

## 2022-02-22 DIAGNOSIS — M79604 Pain in right leg: Secondary | ICD-10-CM | POA: Diagnosis not present

## 2022-02-22 DIAGNOSIS — Z6822 Body mass index (BMI) 22.0-22.9, adult: Secondary | ICD-10-CM | POA: Diagnosis not present

## 2022-02-22 DIAGNOSIS — L03115 Cellulitis of right lower limb: Secondary | ICD-10-CM | POA: Diagnosis not present

## 2022-03-05 DIAGNOSIS — E039 Hypothyroidism, unspecified: Secondary | ICD-10-CM | POA: Diagnosis not present

## 2022-03-13 DIAGNOSIS — E7849 Other hyperlipidemia: Secondary | ICD-10-CM | POA: Diagnosis not present

## 2022-03-13 DIAGNOSIS — Z1389 Encounter for screening for other disorder: Secondary | ICD-10-CM | POA: Diagnosis not present

## 2022-03-13 DIAGNOSIS — E039 Hypothyroidism, unspecified: Secondary | ICD-10-CM | POA: Diagnosis not present

## 2022-03-13 DIAGNOSIS — Z6822 Body mass index (BMI) 22.0-22.9, adult: Secondary | ICD-10-CM | POA: Diagnosis not present

## 2022-03-24 DIAGNOSIS — Z6821 Body mass index (BMI) 21.0-21.9, adult: Secondary | ICD-10-CM | POA: Diagnosis not present

## 2022-03-24 DIAGNOSIS — R197 Diarrhea, unspecified: Secondary | ICD-10-CM | POA: Diagnosis not present

## 2022-03-24 DIAGNOSIS — A0472 Enterocolitis due to Clostridium difficile, not specified as recurrent: Secondary | ICD-10-CM

## 2022-03-24 HISTORY — DX: Enterocolitis due to Clostridium difficile, not specified as recurrent: A04.72

## 2022-04-27 ENCOUNTER — Encounter: Payer: Self-pay | Admitting: Internal Medicine

## 2022-05-08 ENCOUNTER — Ambulatory Visit (AMBULATORY_SURGERY_CENTER): Payer: Self-pay

## 2022-05-08 ENCOUNTER — Telehealth: Payer: Self-pay

## 2022-05-08 VITALS — Ht 69.0 in | Wt 155.0 lb

## 2022-05-08 DIAGNOSIS — Z86010 Personal history of colonic polyps: Secondary | ICD-10-CM

## 2022-05-08 MED ORDER — ONDANSETRON HCL 4 MG PO TABS: 4.0000 mg | ORAL_TABLET | ORAL | 0 refills | Status: AC

## 2022-05-08 MED ORDER — SUTAB 1479-225-188 MG PO TABS: 12.0000 | ORAL_TABLET | ORAL | 0 refills | Status: DC

## 2022-05-08 NOTE — Progress Notes (Signed)
No egg or soy allergy known to patient   No issues known to pt with past sedation with any surgeries or procedures  Patient denies ever being intubated for surgery in the past  No FH of Malignant Hyperthermia  Pt is not on diet pills Pt is not on  home 02  Pt is not on blood thinners   Pt denies issues with constipation   No A fib or A flutter  Pt had C diff  Have any cardiac testing pending--no  Pt instructed to use [email or web address] or GoodRx for a price reduction on prep

## 2022-05-08 NOTE — Telephone Encounter (Signed)
Pt was C Diff positive in August after taking antibiotics for a skin infection.  Pt took course of Vancocin and has not tested again,however, states she has no sx of c diff.     Pt has colon sched with you at the [facility] on 06/06/22, does she need any further testing prior to the procedure? Thank you, Etheleen Nicks RN

## 2022-05-08 NOTE — Telephone Encounter (Signed)
As long as she is symptom-free, okay to schedule. No repeat testing needed. Thanks for checking.

## 2022-05-14 ENCOUNTER — Encounter: Payer: Medicare Other | Admitting: Internal Medicine

## 2022-05-29 ENCOUNTER — Encounter: Payer: Self-pay | Admitting: Internal Medicine

## 2022-06-06 ENCOUNTER — Ambulatory Visit (AMBULATORY_SURGERY_CENTER): Payer: Medicare Other | Admitting: Internal Medicine

## 2022-06-06 ENCOUNTER — Encounter: Payer: Self-pay | Admitting: Internal Medicine

## 2022-06-06 VITALS — BP 122/79 | HR 71 | Temp 96.0°F | Resp 16 | Ht 69.0 in | Wt 155.0 lb

## 2022-06-06 DIAGNOSIS — Z09 Encounter for follow-up examination after completed treatment for conditions other than malignant neoplasm: Secondary | ICD-10-CM

## 2022-06-06 DIAGNOSIS — Z86010 Personal history of colonic polyps: Secondary | ICD-10-CM

## 2022-06-06 DIAGNOSIS — K635 Polyp of colon: Secondary | ICD-10-CM

## 2022-06-06 MED ORDER — SODIUM CHLORIDE 0.9 % IV SOLN: 500.0000 mL | Freq: Once | INTRAVENOUS | Status: DC

## 2022-06-06 NOTE — Progress Notes (Signed)
Called to room to assist during endoscopic procedure.  Patient ID and intended procedure confirmed with present staff. Received instructions for my participation in the procedure from the performing physician.  

## 2022-06-06 NOTE — Op Note (Signed)
[place] Patient Name: Michelle Cochran Procedure Date: 06/06/2022 9:24 AM MRN: [id number] Endoscopist: Docia Chuck. Henrene Pastor , MD, [id number] Age: 66 Referring MD:  Date of Birth: August 03, 1956 Gender: Female Account #: [id number] Procedure:                Colonoscopy with biopsies Indications:              High risk colon cancer surveillance: Personal                            history of adenoma (10 mm or greater in size), High                            risk colon cancer surveillance: Personal history of                            multiple (3 or more) adenomas. Index exam October                            2022. Large right colon polyp removed via EMR piece                            polypectomy technique Medicines:                Monitored Anesthesia Care Procedure:                Pre-Anesthesia Assessment:                           - Prior to the procedure, a History and Physical                            was performed, and patient medications and                            allergies were reviewed. The patient's tolerance of                            previous anesthesia was also reviewed. The risks                            and benefits of the procedure and the sedation                            options and risks were discussed with the patient.                            All questions were answered, and informed consent                            was obtained. Prior Anticoagulants: The patient has                            taken no anticoagulant or antiplatelet agents. ASA  Grade Assessment: II - A patient with mild systemic                            disease. After reviewing the risks and benefits,                            the patient was deemed in satisfactory condition to                            undergo the procedure.                           After obtaining informed consent, the colonoscope                            was passed under  direct vision. Throughout the                            procedure, the patient's blood pressure, pulse, and                            oxygen saturations were monitored continuously. The                            CF [id number] #[id number] was introduced through the anus                            and advanced to the the cecum, identified by                            appendiceal orifice and ileocecal valve. The                            ileocecal valve, appendiceal orifice, and rectum                            were photographed. The quality of the bowel                            preparation was excellent. The colonoscopy was                            performed without difficulty. The patient tolerated                            the procedure well. The bowel preparation used was                            SUTAB via split dose instruction. Scope In: 9:43:24 AM Scope Out: 9:58:31 AM Scope Withdrawal Time: 0 hours 11 minutes 30 seconds  Total Procedure Duration: 0 hours 15 minutes 7 seconds  Findings:                 A few medium-mouthed diverticula were found in the  sigmoid colon.                           The previous polypectomy site was noted in the                            proximal ascending colon. This was easily                            identified the a combination of prior marking                            tattoo and post polypectomy scar. No evidence for                            recurrent or residual neoplasia. The entire                            examined colon appeared otherwise normal on direct                            and retroflexion views. Biopsies of the polypectomy                            scar were taken with a cold forceps for histology. Complications:            No immediate complications. Estimated blood loss:                            None. Estimated Blood Loss:     Estimated blood loss: none. Impression:               -  Diverticulosis in the sigmoid colon.                           - The entire examined colon is normal on direct and                            retroflexion views. Prior post polypectomy site                            without obvious residual neoplasia. Biopsies taken Recommendation:           - Repeat colonoscopy in 3 years for surveillance.                           - Patient has a contact number available for                            emergencies. The signs and symptoms of potential                            delayed complications were discussed with the  patient. Return to normal activities tomorrow.                            Written discharge instructions were provided to the                            patient.                           - Resume previous diet.                           - Continue present medications.                           - Await pathology results. Docia Chuck. Henrene Pastor, MD 06/06/2022 10:07:29 AM This report has been signed electronically.

## 2022-06-06 NOTE — Progress Notes (Signed)
HISTORY OF PRESENT ILLNESS:  Michelle Cochran is a 66 y.o. female who underwent colonoscopy 03/2021.  Multiple adenomatous colon polyps including large right colon lesion removed via EMR.  Now back for surveillance.  REVIEW OF SYSTEMS:  All non-GI ROS negative. Past Medical History:  Diagnosis Date   C. difficile diarrhea 03/24/2022   Hyperlipidemia    Palpitations    Thyroid disease     Past Surgical History:  Procedure Laterality Date   COLONOSCOPY  06/02/2021   NASAL FRACTURE SURGERY  1974    Social History Michelle Cochran  reports that she has never smoked. She has never used smokeless tobacco. She reports current alcohol use of about 1.0 standard drink of alcohol per week. She reports that she does not use drugs.  family history includes Arrhythmia in her father; Cancer in her paternal grandfather; Heart attack (age of onset: 35) in her maternal grandfather; Heart attack (age of onset: 28) in her paternal grandmother.  No Known Allergies     PHYSICAL EXAMINATION: Vital signs: BP 108/66   Pulse 71   Temp (!) 96 F (35.6 C)   Ht '5\' 9"'$  (1.753 m)   Wt 155 lb (70.3 kg)   SpO2 100%   BMI 22.89 kg/m  General: Well-developed, well-nourished, no acute distress HEENT: Sclerae are anicteric, conjunctiva pink. Oral mucosa intact Lungs: Clear Heart: Regular Abdomen: soft, nontender, nondistended, no obvious ascites, no peritoneal signs, normal bowel sounds. No organomegaly. Extremities: No edema Psychiatric: alert and oriented x3. Cooperative      ASSESSMENT:  Multiple advanced adenomatous.  Large lesion with piecemeal resection 1 year ago.  Now for follow-up   PLAN:  Surveillance colonoscopy

## 2022-06-06 NOTE — Progress Notes (Signed)
Sedate, gd SR, tolerated procedure well, VSS, report to RN 

## 2022-06-06 NOTE — Patient Instructions (Signed)
-  Handout on diverticulosis provided  - Repeat colonoscopy in 3 years for surveillance. - Patient has a contact number available for emergencies. The signs and symptoms of potential delayed complications were discussed with the patient. Return to normal activities tomorrow. Written discharge instructions were provided to the patient. - Resume previous diet. - Continue present medications. - Await pathology results.  YOU HAD AN ENDOSCOPIC PROCEDURE TODAY AT [facility]:   Refer to the procedure report that was given to you for any specific questions about what was found during the examination.  If the procedure report does not answer your questions, please call your gastroenterologist to clarify.  If you requested that your care partner not be given the details of your procedure findings, then the procedure report has been included in a sealed envelope for you to review at your convenience later.  YOU SHOULD EXPECT: Some feelings of bloating in the abdomen. Passage of more gas than usual.  Walking can help get rid of the air that was put into your GI tract during the procedure and reduce the bloating. If you had a lower endoscopy (such as a colonoscopy or flexible sigmoidoscopy) you may notice spotting of blood in your stool or on the toilet paper. If you underwent a bowel prep for your procedure, you may not have a normal bowel movement for a few days.  Please Note:  You might notice some irritation and congestion in your nose or some drainage.  This is from the oxygen used during your procedure.  There is no need for concern and it should clear up in a day or so.  SYMPTOMS TO REPORT IMMEDIATELY:  Following lower endoscopy (colonoscopy or flexible sigmoidoscopy):  Excessive amounts of blood in the stool  Significant tenderness or worsening of abdominal pains  Swelling of the abdomen that is new, acute  Fever of 100F or higher   For urgent or emergent issues, a  gastroenterologist can be reached at any hour by calling [phone]. Do not use MyChart messaging for urgent concerns.    DIET:  We do recommend a small meal at first, but then you may proceed to your regular diet.  Drink plenty of fluids but you should avoid alcoholic beverages for 24 hours.  ACTIVITY:  You should plan to take it easy for the rest of today and you should NOT DRIVE or use heavy machinery until tomorrow (because of the sedation medicines used during the test).    FOLLOW UP: Our staff will call the number listed on your records the next business day following your procedure.  We will call around 7:15- 8:00 am to check on you and address any questions or concerns that you may have regarding the information given to you following your procedure. If we do not reach you, we will leave a message.     If any biopsies were taken you will be contacted by phone or by letter within the next 1-3 weeks.  Please call us at [phone] if you have not heard about the biopsies in 3 weeks.    SIGNATURES/CONFIDENTIALITY: You and/or your care partner have signed paperwork which will be entered into your electronic medical record.  These signatures attest to the fact that that the information above on your After Visit Summary has been reviewed and is understood.  Full responsibility of the confidentiality of this discharge information lies with you and/or your care-partner.

## 2022-06-06 NOTE — Progress Notes (Signed)
Pt's states no medical or surgical changes since previsit or office visit. 

## 2022-06-07 ENCOUNTER — Telehealth: Payer: Self-pay

## 2022-06-07 NOTE — Telephone Encounter (Signed)
  Follow up Call-     06/06/2022    8:19 AM 06/02/2021    7:24 AM  Call back number  Post procedure Call Back phone  # [phone]  Permission to leave phone message Yes Yes     Patient questions:  Do you have a fever, pain , or abdominal swelling? No. Pain Score  0 *  Have you tolerated food without any problems? Yes.    Have you been able to return to your normal activities? Yes.    Do you have any questions about your discharge instructions: Diet   No. Medications  No. Follow up visit  No.  Do you have questions or concerns about your Care? No.  Actions: * If pain score is 4 or above: No action needed, pain <4.

## 2022-06-08 ENCOUNTER — Encounter: Payer: Self-pay | Admitting: Internal Medicine

## 2022-07-10 DIAGNOSIS — L821 Other seborrheic keratosis: Secondary | ICD-10-CM | POA: Diagnosis not present

## 2022-07-10 DIAGNOSIS — L570 Actinic keratosis: Secondary | ICD-10-CM | POA: Diagnosis not present

## 2022-07-10 DIAGNOSIS — L814 Other melanin hyperpigmentation: Secondary | ICD-10-CM | POA: Diagnosis not present

## 2022-07-10 DIAGNOSIS — D2371 Other benign neoplasm of skin of right lower limb, including hip: Secondary | ICD-10-CM | POA: Diagnosis not present

## 2022-07-10 DIAGNOSIS — Z85828 Personal history of other malignant neoplasm of skin: Secondary | ICD-10-CM | POA: Diagnosis not present

## 2022-07-10 DIAGNOSIS — Z08 Encounter for follow-up examination after completed treatment for malignant neoplasm: Secondary | ICD-10-CM | POA: Diagnosis not present

## 2022-08-07 DIAGNOSIS — C44729 Squamous cell carcinoma of skin of left lower limb, including hip: Secondary | ICD-10-CM | POA: Diagnosis not present

## 2022-08-07 DIAGNOSIS — D492 Neoplasm of unspecified behavior of bone, soft tissue, and skin: Secondary | ICD-10-CM | POA: Diagnosis not present

## 2022-08-21 DIAGNOSIS — C44729 Squamous cell carcinoma of skin of left lower limb, including hip: Secondary | ICD-10-CM | POA: Diagnosis not present

## 2022-08-27 DIAGNOSIS — L089 Local infection of the skin and subcutaneous tissue, unspecified: Secondary | ICD-10-CM | POA: Diagnosis not present

## 2022-09-03 DIAGNOSIS — Z48817 Encounter for surgical aftercare following surgery on the skin and subcutaneous tissue: Secondary | ICD-10-CM | POA: Diagnosis not present

## 2022-09-03 DIAGNOSIS — Z4801 Encounter for change or removal of surgical wound dressing: Secondary | ICD-10-CM | POA: Diagnosis not present

## 2022-09-10 DIAGNOSIS — S81802A Unspecified open wound, left lower leg, initial encounter: Secondary | ICD-10-CM | POA: Diagnosis not present

## 2022-09-28 DIAGNOSIS — S81802A Unspecified open wound, left lower leg, initial encounter: Secondary | ICD-10-CM | POA: Diagnosis not present

## 2022-10-04 DIAGNOSIS — D485 Neoplasm of uncertain behavior of skin: Secondary | ICD-10-CM | POA: Diagnosis not present

## 2022-10-04 DIAGNOSIS — C44729 Squamous cell carcinoma of skin of left lower limb, including hip: Secondary | ICD-10-CM | POA: Diagnosis not present

## 2022-10-11 DIAGNOSIS — C44729 Squamous cell carcinoma of skin of left lower limb, including hip: Secondary | ICD-10-CM | POA: Diagnosis not present

## 2022-11-13 DIAGNOSIS — L820 Inflamed seborrheic keratosis: Secondary | ICD-10-CM | POA: Diagnosis not present

## 2022-11-13 DIAGNOSIS — S81802A Unspecified open wound, left lower leg, initial encounter: Secondary | ICD-10-CM | POA: Diagnosis not present

## 2022-12-17 DIAGNOSIS — D2371 Other benign neoplasm of skin of right lower limb, including hip: Secondary | ICD-10-CM | POA: Diagnosis not present

## 2022-12-17 DIAGNOSIS — D2372 Other benign neoplasm of skin of left lower limb, including hip: Secondary | ICD-10-CM | POA: Diagnosis not present

## 2022-12-17 DIAGNOSIS — L814 Other melanin hyperpigmentation: Secondary | ICD-10-CM | POA: Diagnosis not present

## 2022-12-17 DIAGNOSIS — L298 Other pruritus: Secondary | ICD-10-CM | POA: Diagnosis not present

## 2022-12-17 DIAGNOSIS — Z85828 Personal history of other malignant neoplasm of skin: Secondary | ICD-10-CM | POA: Diagnosis not present

## 2022-12-17 DIAGNOSIS — L538 Other specified erythematous conditions: Secondary | ICD-10-CM | POA: Diagnosis not present

## 2022-12-17 DIAGNOSIS — D225 Melanocytic nevi of trunk: Secondary | ICD-10-CM | POA: Diagnosis not present

## 2022-12-17 DIAGNOSIS — Z789 Other specified health status: Secondary | ICD-10-CM | POA: Diagnosis not present

## 2022-12-17 DIAGNOSIS — Z08 Encounter for follow-up examination after completed treatment for malignant neoplasm: Secondary | ICD-10-CM | POA: Diagnosis not present

## 2022-12-17 DIAGNOSIS — L821 Other seborrheic keratosis: Secondary | ICD-10-CM | POA: Diagnosis not present

## 2023-02-23 DIAGNOSIS — R197 Diarrhea, unspecified: Secondary | ICD-10-CM | POA: Diagnosis not present

## 2023-02-23 DIAGNOSIS — R030 Elevated blood-pressure reading, without diagnosis of hypertension: Secondary | ICD-10-CM | POA: Diagnosis not present

## 2023-02-23 DIAGNOSIS — R1084 Generalized abdominal pain: Secondary | ICD-10-CM | POA: Diagnosis not present

## 2023-02-23 DIAGNOSIS — A084 Viral intestinal infection, unspecified: Secondary | ICD-10-CM | POA: Diagnosis not present

## 2023-02-23 DIAGNOSIS — R110 Nausea: Secondary | ICD-10-CM | POA: Diagnosis not present

## 2023-02-23 DIAGNOSIS — Z6824 Body mass index (BMI) 24.0-24.9, adult: Secondary | ICD-10-CM | POA: Diagnosis not present

## 2023-03-21 DIAGNOSIS — E039 Hypothyroidism, unspecified: Secondary | ICD-10-CM | POA: Diagnosis not present

## 2023-03-21 DIAGNOSIS — E7849 Other hyperlipidemia: Secondary | ICD-10-CM | POA: Diagnosis not present

## 2023-03-27 DIAGNOSIS — G4700 Insomnia, unspecified: Secondary | ICD-10-CM | POA: Diagnosis not present

## 2023-03-27 DIAGNOSIS — E7849 Other hyperlipidemia: Secondary | ICD-10-CM | POA: Diagnosis not present

## 2023-03-27 DIAGNOSIS — Z1389 Encounter for screening for other disorder: Secondary | ICD-10-CM | POA: Diagnosis not present

## 2023-03-27 DIAGNOSIS — R030 Elevated blood-pressure reading, without diagnosis of hypertension: Secondary | ICD-10-CM | POA: Diagnosis not present

## 2023-03-27 DIAGNOSIS — E039 Hypothyroidism, unspecified: Secondary | ICD-10-CM | POA: Diagnosis not present

## 2023-04-03 DIAGNOSIS — Z85828 Personal history of other malignant neoplasm of skin: Secondary | ICD-10-CM | POA: Diagnosis not present

## 2023-04-03 DIAGNOSIS — Z08 Encounter for follow-up examination after completed treatment for malignant neoplasm: Secondary | ICD-10-CM | POA: Diagnosis not present

## 2023-04-03 DIAGNOSIS — L821 Other seborrheic keratosis: Secondary | ICD-10-CM | POA: Diagnosis not present

## 2023-04-03 DIAGNOSIS — D2371 Other benign neoplasm of skin of right lower limb, including hip: Secondary | ICD-10-CM | POA: Diagnosis not present

## 2023-09-11 DIAGNOSIS — E782 Mixed hyperlipidemia: Secondary | ICD-10-CM | POA: Diagnosis not present

## 2023-09-11 DIAGNOSIS — E039 Hypothyroidism, unspecified: Secondary | ICD-10-CM | POA: Diagnosis not present

## 2023-09-11 DIAGNOSIS — R5383 Other fatigue: Secondary | ICD-10-CM | POA: Diagnosis not present

## 2023-09-17 DIAGNOSIS — M25511 Pain in right shoulder: Secondary | ICD-10-CM | POA: Diagnosis not present

## 2023-09-17 DIAGNOSIS — G8929 Other chronic pain: Secondary | ICD-10-CM | POA: Diagnosis not present

## 2023-09-17 DIAGNOSIS — S43431D Superior glenoid labrum lesion of right shoulder, subsequent encounter: Secondary | ICD-10-CM | POA: Diagnosis not present

## 2023-09-17 DIAGNOSIS — M19011 Primary osteoarthritis, right shoulder: Secondary | ICD-10-CM | POA: Diagnosis not present

## 2023-09-17 DIAGNOSIS — X58XXXD Exposure to other specified factors, subsequent encounter: Secondary | ICD-10-CM | POA: Diagnosis not present

## 2023-09-17 DIAGNOSIS — M75101 Unspecified rotator cuff tear or rupture of right shoulder, not specified as traumatic: Secondary | ICD-10-CM | POA: Diagnosis not present

## 2023-09-17 DIAGNOSIS — M1712 Unilateral primary osteoarthritis, left knee: Secondary | ICD-10-CM | POA: Diagnosis not present

## 2023-09-18 DIAGNOSIS — Z6824 Body mass index (BMI) 24.0-24.9, adult: Secondary | ICD-10-CM | POA: Diagnosis not present

## 2023-09-18 DIAGNOSIS — E039 Hypothyroidism, unspecified: Secondary | ICD-10-CM | POA: Diagnosis not present

## 2023-09-18 DIAGNOSIS — E782 Mixed hyperlipidemia: Secondary | ICD-10-CM | POA: Diagnosis not present

## 2023-09-18 DIAGNOSIS — G4700 Insomnia, unspecified: Secondary | ICD-10-CM | POA: Diagnosis not present

## 2023-09-18 DIAGNOSIS — Z23 Encounter for immunization: Secondary | ICD-10-CM | POA: Diagnosis not present

## 2023-12-19 DIAGNOSIS — Z6824 Body mass index (BMI) 24.0-24.9, adult: Secondary | ICD-10-CM | POA: Diagnosis not present

## 2023-12-19 DIAGNOSIS — R051 Acute cough: Secondary | ICD-10-CM | POA: Diagnosis not present

## 2023-12-19 DIAGNOSIS — J018 Other acute sinusitis: Secondary | ICD-10-CM | POA: Diagnosis not present

## 2023-12-30 DIAGNOSIS — R110 Nausea: Secondary | ICD-10-CM | POA: Diagnosis not present

## 2023-12-30 DIAGNOSIS — R051 Acute cough: Secondary | ICD-10-CM | POA: Diagnosis not present

## 2023-12-30 DIAGNOSIS — Z6824 Body mass index (BMI) 24.0-24.9, adult: Secondary | ICD-10-CM | POA: Diagnosis not present

## 2023-12-30 DIAGNOSIS — R059 Cough, unspecified: Secondary | ICD-10-CM | POA: Diagnosis not present

## 2023-12-31 DIAGNOSIS — R051 Acute cough: Secondary | ICD-10-CM | POA: Diagnosis not present

## 2024-01-17 DIAGNOSIS — G8929 Other chronic pain: Secondary | ICD-10-CM | POA: Diagnosis not present

## 2024-01-17 DIAGNOSIS — M25561 Pain in right knee: Secondary | ICD-10-CM | POA: Diagnosis not present

## 2024-01-28 DIAGNOSIS — Z1231 Encounter for screening mammogram for malignant neoplasm of breast: Secondary | ICD-10-CM | POA: Diagnosis not present

## 2024-03-24 DIAGNOSIS — L538 Other specified erythematous conditions: Secondary | ICD-10-CM | POA: Diagnosis not present

## 2024-03-24 DIAGNOSIS — D225 Melanocytic nevi of trunk: Secondary | ICD-10-CM | POA: Diagnosis not present

## 2024-03-24 DIAGNOSIS — L570 Actinic keratosis: Secondary | ICD-10-CM | POA: Diagnosis not present

## 2024-03-24 DIAGNOSIS — L814 Other melanin hyperpigmentation: Secondary | ICD-10-CM | POA: Diagnosis not present

## 2024-03-24 DIAGNOSIS — L578 Other skin changes due to chronic exposure to nonionizing radiation: Secondary | ICD-10-CM | POA: Diagnosis not present

## 2024-03-24 DIAGNOSIS — L821 Other seborrheic keratosis: Secondary | ICD-10-CM | POA: Diagnosis not present

## 2024-03-24 DIAGNOSIS — Z85828 Personal history of other malignant neoplasm of skin: Secondary | ICD-10-CM | POA: Diagnosis not present

## 2024-03-24 DIAGNOSIS — Z08 Encounter for follow-up examination after completed treatment for malignant neoplasm: Secondary | ICD-10-CM | POA: Diagnosis not present

## 2024-03-24 DIAGNOSIS — D492 Neoplasm of unspecified behavior of bone, soft tissue, and skin: Secondary | ICD-10-CM | POA: Diagnosis not present

## 2024-03-24 DIAGNOSIS — L820 Inflamed seborrheic keratosis: Secondary | ICD-10-CM | POA: Diagnosis not present
